# Patient Record
Sex: Female | Born: 1940 | Hispanic: No | Marital: Married | Smoking: Never smoker
Health system: Southern US, Community
[De-identification: ages and names within clinical notes are randomized; demographics above are authoritative.]

## PROBLEM LIST (undated history)

## (undated) DIAGNOSIS — H269 Unspecified cataract: Secondary | ICD-10-CM

## (undated) HISTORY — DX: Unspecified cataract: H26.9

---

## 2011-10-18 ENCOUNTER — Emergency Department (HOSPITAL_COMMUNITY)
Admission: EM | Admit: 2011-10-18 | Discharge: 2011-10-18 | Disposition: A | Discharge status: Home / Self Care | Payer: Self-pay | Attending: Emergency Medicine | Admitting: Emergency Medicine

## 2011-10-18 ENCOUNTER — Emergency Department (HOSPITAL_COMMUNITY): Payer: Self-pay

## 2011-10-18 ENCOUNTER — Encounter (HOSPITAL_COMMUNITY): Payer: Self-pay | Admitting: *Deleted

## 2011-10-18 DIAGNOSIS — W19XXXA Unspecified fall, initial encounter: Secondary | ICD-10-CM

## 2011-10-18 DIAGNOSIS — E049 Nontoxic goiter, unspecified: Secondary | ICD-10-CM | POA: Insufficient documentation

## 2011-10-18 DIAGNOSIS — S20219A Contusion of unspecified front wall of thorax, initial encounter: Secondary | ICD-10-CM | POA: Insufficient documentation

## 2011-10-18 DIAGNOSIS — W010XXA Fall on same level from slipping, tripping and stumbling without subsequent striking against object, initial encounter: Secondary | ICD-10-CM | POA: Insufficient documentation

## 2011-10-18 NOTE — Discharge Instructions (Signed)
Insufficient Money for Medicine Contact [employer]:  call "211" or [facility] [phone].  No Primary Care Doctor Call [facility]  [phone] Other agencies that provide inexpensive medical care    [facility]  [phone]    [facility]  [phone]    [facility]  [phone]    [facility]  [phone]    [facility]  [phone]    [facility]  [phone]  

## 2011-10-18 NOTE — ED Notes (Signed)
Pt reports right rib cage pain after falling 5 days ago. Hurts to take deep breath but denies sob. States tripped and fell to floor. Denies loc.

## 2011-10-18 NOTE — ED Provider Notes (Addendum)
History  This chart was scribed for Gwyneth Sprout, MD by Cherlynn Perches. The patient was seen in room STRE1/STRE1. Patient's care was started at 1101.  CSN: [id number]  Arrival date & time 10/18/11  1101   None     Chief Complaint  Patient presents with  . Fall    (Consider location/radiation/quality/duration/timing/severity/associated sxs/prior treatment) HPI  Cheryl Bradford is a 71 y.o. female who presents to the Emergency Department complaining of 5 days of sudden onset, constant right rib pain with associated pain when breathing and right knee pain related to a fall. Pt states that she tripped over a rug and fell on the floor, injuring her rib. Pt denies any head injury, SOB, coughing, and loss of consciousness.    History reviewed. No pertinent past medical history.  History reviewed. No pertinent past surgical history.  History reviewed. No pertinent family history.  History  Substance Use Topics  . Smoking status: Never Smoker   . Smokeless tobacco: Not on file  . Alcohol Use: No    OB History    Grav Para Term Preterm Abortions TAB SAB Ect Mult Living                  Review of Systems  Constitutional: Negative for fever and chills.  HENT: Negative for congestion and neck pain.   Respiratory: Negative for cough and shortness of breath.   Cardiovascular: Negative for leg swelling.  Gastrointestinal: Negative for nausea and vomiting.  Genitourinary: Negative for frequency and hematuria.  Musculoskeletal: Positive for myalgias and arthralgias. Negative for back pain.  Neurological: Negative for syncope, weakness and headaches.  Hematological: Negative.   All other systems reviewed and are negative.    Allergies  Review of patient's allergies indicates no known allergies.  Home Medications   Current Outpatient Rx  Name Route Sig Dispense Refill  . DOCUSATE SODIUM 100 MG PO CAPS Oral Take 100 mg by mouth 2 (two) times daily.    . IBUPROFEN 200 MG PO  TABS Oral Take 200 mg by mouth every 6 (six) hours as needed. For pain      BP 123/58  Pulse 91  Temp 97.9 F (36.6 C) (Oral)  Resp 20  SpO2 98%  Physical Exam  Nursing note and vitals reviewed. Constitutional: She is oriented to person, place, and time. She appears well-developed and well-nourished. No distress.  HENT:  Head: Normocephalic and atraumatic.  Eyes: EOM are normal.  Neck: Neck supple. No tracheal deviation present.  Cardiovascular: Normal rate, regular rhythm and normal heart sounds.   Pulmonary/Chest: Effort normal. No respiratory distress. She exhibits tenderness (right sideded chest tenderness).       No crepitis, normal breath sounds  Abdominal: Soft. She exhibits no distension. There is tenderness (mild RUQ tenderness). There is no rebound and no guarding.  Musculoskeletal: Normal range of motion.  Neurological: She is alert and oriented to person, place, and time.  Skin: Skin is warm and dry.  Psychiatric: She has a normal mood and affect. Her behavior is normal.    ED Course  Procedures (including critical care time)  DIAGNOSTIC STUDIES: Oxygen Saturation is 98% on room air, normal by my interpretation.    COORDINATION OF CARE: 11:55AM - Will get x-ray of rib. Pt agrees with plan.    Labs Reviewed - No data to display Dg Ribs Unilateral W/chest Right  10/18/2011  *RADIOLOGY REPORT*  Clinical Data: History of fall complaining of right-sided chest pain.  RIGHT RIBS AND CHEST -  3+ VIEW  Comparison: No priors.  Findings: Lungs appear mildly hyperexpanded with some pruning of the pulmonary vasculature in the periphery, suggesting underlying COPD.  Mild bilateral apical pleural parenchymal thickening is likely reflects scarring related to prior infections.  Mild diffuse interstitial prominence and peribronchial cuffing is favored to be chronic (likely related to underlying COPD).  No acute consolidative airspace disease.  No pleural effusion.  No pneumothorax.   Heart size is borderline enlarged.  Mediastinal contours are remarkable for prominent soft tissue at the level of the thoracic inlet which displaces the proximal trachea to the right, favored to represent an enlarged thyroid (likely a goiter). Atherosclerotic calcifications are noted within the arch of the aorta.  Dedicated views of the right ribs demonstrate no definite acute displaced rib fractures.  IMPRESSION: 1.  No acute displaced rib fractures or findings to suggest significant acute traumatic injury to the thorax. 2.  Changes suggestive of COPD, as above. 3.  Atherosclerosis. 4.  Prominent soft tissue at the level of the thoracic inlet resulting and rightward tracheal deviation.  This is favored to represent a goiter, however, if the patient has a known history of malignancy, the possibility of mediastinal adenopathy is not excluded.  This could be further evaluated with non emergent thyroid ultrasound if clinically indicated.  Original Report Authenticated By: Florencia Reasons, M.D.     1. Fall   2. Rib contusion   3. Goiter       MDM   Patient with mechanical fall 5 days ago with persistent right-sided rib cage pain when she takes a deep breath. She has no shortness of breath negative exam otherwise. She has mild right upper quadrant tenderness which she states she's had for years and she says she just doesn't like people touching her abdomen. Also she has a known history of a goiter which is present on her x-rays today. She has no sign of rib fractures and this was relayed to the family.  Will have him followup with PCP for ultrasound of her thyroid and further treatment of her goiter as needed      I personally performed the services described in this documentation, which was scribed in my presence.  The recorded information has been reviewed and considered.    Gwyneth Sprout, MD 10/18/11 [id number]  Gwyneth Sprout, MD 10/18/11 1259

## 2011-10-18 NOTE — ED Notes (Addendum)
Pt does not speak [place] . Per family pt tripped on rug and fell on rt side hurts under rt breaths hurts to take a deep breath . States no loc denies hitting her head  There is no cough. Also pt states per family  that for the past year  When she sits on soft surfaces she feels a bending in her abd/ stonmach, she has a dry mouth and swelling in her throat/ thyroid arae

## 2014-08-02 ENCOUNTER — Ambulatory Visit: Payer: Self-pay | Admitting: Family Medicine

## 2014-08-02 ENCOUNTER — Encounter: Payer: Self-pay | Admitting: Internal Medicine

## 2014-08-02 ENCOUNTER — Ambulatory Visit (INDEPENDENT_AMBULATORY_CARE_PROVIDER_SITE_OTHER): Payer: Medicaid Other | Admitting: Internal Medicine

## 2014-08-02 VITALS — BP 115/54 | HR 55 | Temp 98.2°F | Resp 16 | Ht 62.0 in | Wt 117.0 lb

## 2014-08-02 DIAGNOSIS — E049 Nontoxic goiter, unspecified: Secondary | ICD-10-CM | POA: Diagnosis not present

## 2014-08-02 DIAGNOSIS — Z23 Encounter for immunization: Secondary | ICD-10-CM | POA: Diagnosis not present

## 2014-08-02 DIAGNOSIS — R1031 Right lower quadrant pain: Secondary | ICD-10-CM

## 2014-08-02 DIAGNOSIS — H269 Unspecified cataract: Secondary | ICD-10-CM | POA: Diagnosis not present

## 2014-08-02 LAB — CBC WITH DIFFERENTIAL/PLATELET
Basophils Absolute: 0.1 10*3/uL (ref 0.0–0.1)
Basophils Relative: 1 % (ref 0–1)
EOS ABS: 0.1 10*3/uL (ref 0.0–0.7)
Eosinophils Relative: 1 % (ref 0–5)
HCT: 42.1 % (ref 36.0–46.0)
Hemoglobin: 14.2 g/dL (ref 12.0–15.0)
LYMPHS ABS: 1.9 10*3/uL (ref 0.7–4.0)
LYMPHS PCT: 37 % (ref 12–46)
MCH: 31.2 pg (ref 26.0–34.0)
MCHC: 33.7 g/dL (ref 30.0–36.0)
MCV: 92.5 fL (ref 78.0–100.0)
MPV: 12 fL (ref 8.6–12.4)
Monocytes Absolute: 0.3 10*3/uL (ref 0.1–1.0)
Monocytes Relative: 6 % (ref 3–12)
NEUTROS PCT: 55 % (ref 43–77)
Neutro Abs: 2.9 10*3/uL (ref 1.7–7.7)
Platelets: 177 10*3/uL (ref 150–400)
RBC: 4.55 MIL/uL (ref 3.87–5.11)
RDW: 13.1 % (ref 11.5–15.5)
WBC: 5.2 10*3/uL (ref 4.0–10.5)

## 2014-08-02 LAB — T3, FREE: T3, Free: 2.9 pg/mL (ref 2.3–4.2)

## 2014-08-02 LAB — COMPREHENSIVE METABOLIC PANEL
ALBUMIN: 3.9 g/dL (ref 3.5–5.2)
ALK PHOS: 76 U/L (ref 39–117)
ALT: 8 U/L (ref 0–35)
AST: 17 U/L (ref 0–37)
BILIRUBIN TOTAL: 0.4 mg/dL (ref 0.2–1.2)
BUN: 7 mg/dL (ref 6–23)
CO2: 27 mEq/L (ref 19–32)
Calcium: 8.4 mg/dL (ref 8.4–10.5)
Chloride: 105 mEq/L (ref 96–112)
Creat: 0.54 mg/dL (ref 0.50–1.10)
GLUCOSE: 94 mg/dL (ref 70–99)
POTASSIUM: 4.3 meq/L (ref 3.5–5.3)
Sodium: 139 mEq/L (ref 135–145)
Total Protein: 7.2 g/dL (ref 6.0–8.3)

## 2014-08-02 LAB — TSH: TSH: 0.876 u[IU]/mL (ref 0.350–4.500)

## 2014-08-02 LAB — T4, FREE: Free T4: 0.99 ng/dL (ref 0.80–1.80)

## 2014-08-03 ENCOUNTER — Encounter: Payer: Self-pay | Admitting: Internal Medicine

## 2014-08-03 LAB — URINALYSIS
Bilirubin Urine: NEGATIVE
Glucose, UA: NEGATIVE mg/dL
HGB URINE DIPSTICK: NEGATIVE
KETONES UR: NEGATIVE mg/dL
Leukocytes, UA: NEGATIVE
NITRITE: NEGATIVE
PH: 8 (ref 5.0–8.0)
Protein, ur: NEGATIVE mg/dL
Specific Gravity, Urine: 1.009 (ref 1.005–1.030)
UROBILINOGEN UA: 0.2 mg/dL (ref 0.0–1.0)

## 2014-08-03 NOTE — Progress Notes (Signed)
Patient ID: Cheryl Bradford, female   DOB: Jan 29, 1941, 74 y.o.   MRN: [id number]   Cheryl Bradford, is a 74 y.o. female  WUJ:[id number]  NFA:[id number]  DOB - July 02, 1940  CC:  Chief Complaint  Patient presents with  . Establish Care    stomach issues with constipation/ swelling in neck/ eye         HPI: Cheryl Bradford is a 74 y.o. female here today to establish medical care. She is an [place] non-english speaking lady who has been in the [place] since 2006. Interpreter was available by phone and her son who is fluent in english was present for the entirie  Visit. The patient's who is an Infectious Disease Physician was able to contribute to her History.   The patient denies any chronic disease and currently is not on any medications. Her complaint today is of a pain in her abdomen which has been present for more than 2 years.  She describes as of moderate intensity and is like a cramping. The pain is non-radiating and is not present at rest. A review of records shows that she presented to the ED in 2013 with c/o pain after falling. She had an evaluation including an x-ray of the ribs which showed no acute trauma to the bony structures however it did show atherosclerotic changes.   On observation. Patient is also noted to have a soft tissue mass at the neck. I spoke with her son who reports that it was present for some time which is supported by  notes from 2013 and that it has got larger since then. There is no change is the quality of her voice and she has no difficulty breathing. She denies, palpitations, Diarrhea or constipation, heat or cold intolerance or hair loss. She also denies any shin changes or rashes. She also denies known exposure to radiation.  Pt also c/o difficulty reading. Her son reports that she was diagnosed with cataracts while in [place].   Patient has No headache, No chest pain,   No Nausea, No new weakness tingling or numbness, No Cough - SOB.  No Known Allergies No past  medical history on file. Current Outpatient Prescriptions on File Prior to Visit  Medication Sig Dispense Refill  . docusate sodium (COLACE) 100 MG capsule Take 100 mg by mouth 2 (two) times daily.    Marland Kitchen ibuprofen (ADVIL,MOTRIN) 200 MG tablet Take 200 mg by mouth every 6 (six) hours as needed. For pain     No current facility-administered medications on file prior to visit.   Family History  Problem Relation Age of Onset  . Family history unknown: Yes   History   Social History  . Marital Status: Married    Spouse Name: N/A  . Number of Children: N/A  . Years of Education: N/A   Occupational History  . Not on file.   Social History Main Topics  . Smoking status: Never Smoker   . Smokeless tobacco: Not on file  . Alcohol Use: No  . Drug Use: Not on file  . Sexual Activity: Not on file   Other Topics Concern  . Not on file   Social History Narrative    Review of Systems: Constitutional: Negative for fever, chills, diaphoresis, activity change, appetite change and fatigue. HENT: Negative for ear pain, nosebleeds, congestion, facial swelling, rhinorrhea, neck pain, neck stiffness and ear discharge.  Eyes: Negative for pain, discharge, redness, itching and visual disturbance. Respiratory: Negative for cough, choking, chest tightness, shortness of breath,  wheezing and stridor.  Cardiovascular: Negative for chest pain, palpitations and leg swelling. Gastrointestinal: Negative for abdominal distention. Genitourinary: Negative for dysuria, urgency, frequency, hematuria, flank pain, decreased urine volume, difficulty urinating and dyspareunia.  Musculoskeletal: Negative for back pain, joint swelling, arthralgia and gait problem. Neurological: Negative for dizziness, tremors, seizures, syncope, facial asymmetry, speech difficulty, weakness, light-headedness, numbness and headaches.  Hematological: Negative for adenopathy. Does not bruise/bleed easily. Psychiatric/Behavioral:  Negative for hallucinations, behavioral problems, confusion, dysphoric mood, decreased concentration and agitation.     Objective:   Filed Vitals:   08/02/14 1109  BP: 115/54  Pulse: 55  Temp: 98.2 F (36.8 C)  Resp: 16    Physical Exam: Constitutional: Patient appears well-developed and well-nourished. No distress. HENT: Normocephalic, atraumatic, External right and left ear normal. Oropharynx is clear and moist.  Eyes: Conjunctivae and EOM are normal. PERRLA, no scleral icterus. Neck: Normal ROM. Neck supple. No JVD. Symmetrically enlarges thyroid which is movable and without palpable nodules. The is no stridor and no bruit auscultated. CVS: RRR, S1/S2 +, no murmurs, no gallops, no carotid bruit.  Pulmonary: Effort and breath sounds normal, no stridor, rhonchi, wheezes, rales.  Abdominal: Soft. BS +, no distension, tenderness, rebound or guarding.  Musculoskeletal: Normal range of motion. No edema and no tenderness.  Lymphadenopathy: No lymphadenopathy noted, cervical or axillary Neuro: Alert. Normal reflexes, muscle tone coordination. No cranial nerve deficit. Skin: Skin is warm and dry. No rash noted. Not diaphoretic. No erythema. No pallor. Psychiatric: Normal mood and affect. Behavior, judgment, thought content normal.  Lab Results  Component Value Date   WBC 5.2 08/02/2014   HGB 14.2 08/02/2014   HCT 42.1 08/02/2014   MCV 92.5 08/02/2014   PLT 177 08/02/2014   Lab Results  Component Value Date   CREATININE 0.54 08/02/2014   BUN 7 08/02/2014   NA 139 08/02/2014   K 4.3 08/02/2014   CL 105 08/02/2014   CO2 27 08/02/2014    No results found for: HGBA1C Lipid Panel  No results found for: CHOL, TRIG, HDL, CHOLHDL, VLDL, LDLCALC     Assessment and plan:   1. Goiter - Pt has been present for several years with no appreciable signs of thyroid dysfunction. Thus it is likely benign except that it's rapid growth could be a sing of malignancy. However I will obtain  an ultrasound to assess nodularity and evaluate sonographic features. Also obtain a TSH - TSH - T4, Free - T3, Free  2. Right lower quadrant abdominal pain - This pain has been present for several years. I'm not sure that this is a medically significant complaint. However will obtain labs to evaluate renal function and electrolytes. Also evaluate for liver function and hematologic status. - Urinalysis - Comprehensive metabolic panel - CBC with Differential/Platelet  3. Immunization due - Son reports that she may have received a Pneumonia vaccine several years ago. I will give the Prevnar as she has not received a vaccine in the last year and her son will obtain her immunization record. If unable to  Verify will give Pneumovax in 8 weeks.   - Pneumococcal conjugate vaccine 13-valent  4. Cataract - Pt's son reports a history of cataracts. I will refer to Ophthalmology for evaluation and glaucoma screen. - Ambulatory referral to Ophthalmology    Return in about 2 weeks (around 08/16/2014) for Goiter, Review of lab data.  The patient was given clear instructions to go to ER or return to medical center if symptoms don't improve,  worsen or new problems develop. The patient verbalized understanding. The patient was told to call to get lab results if they haven't heard anything in the next week.     This note has been created with Education officer, environmental. Any transcriptional errors are unintentional.    MATTHEWS,MICHELLE A., MD [facility] [place], [place] [phone]   08/03/2014, 1:48 PM

## 2014-08-16 ENCOUNTER — Other Ambulatory Visit (HOSPITAL_COMMUNITY): Payer: Medicaid Other

## 2014-08-16 ENCOUNTER — Ambulatory Visit (INDEPENDENT_AMBULATORY_CARE_PROVIDER_SITE_OTHER): Payer: Medicaid Other | Admitting: Internal Medicine

## 2014-08-16 VITALS — BP 123/57 | HR 78 | Temp 98.4°F | Resp 16 | Ht 62.0 in | Wt 118.0 lb

## 2014-08-16 DIAGNOSIS — R221 Localized swelling, mass and lump, neck: Secondary | ICD-10-CM | POA: Diagnosis not present

## 2014-08-16 DIAGNOSIS — Z1211 Encounter for screening for malignant neoplasm of colon: Secondary | ICD-10-CM

## 2014-08-17 LAB — THYROID PEROXIDASE ANTIBODY: Thyroperoxidase Ab SerPl-aCnc: 1 IU/mL (ref <9)

## 2014-08-22 ENCOUNTER — Ambulatory Visit (HOSPITAL_COMMUNITY): Payer: Medicaid Other

## 2014-08-23 ENCOUNTER — Ambulatory Visit (HOSPITAL_COMMUNITY)
Admission: RE | Admit: 2014-08-23 | Discharge: 2014-08-23 | Disposition: A | Discharge status: Home / Self Care | Payer: Medicaid Other | Source: Ambulatory Visit | Attending: Internal Medicine | Admitting: Internal Medicine

## 2014-08-23 DIAGNOSIS — E010 Iodine-deficiency related diffuse (endemic) goiter: Secondary | ICD-10-CM | POA: Insufficient documentation

## 2014-08-23 DIAGNOSIS — R221 Localized swelling, mass and lump, neck: Secondary | ICD-10-CM | POA: Insufficient documentation

## 2014-10-29 ENCOUNTER — Encounter: Payer: Self-pay | Admitting: Internal Medicine

## 2014-10-29 ENCOUNTER — Telehealth: Payer: Self-pay | Admitting: Internal Medicine

## 2014-10-29 ENCOUNTER — Other Ambulatory Visit: Payer: Self-pay | Admitting: Internal Medicine

## 2014-10-29 DIAGNOSIS — R221 Localized swelling, mass and lump, neck: Secondary | ICD-10-CM

## 2014-10-29 NOTE — Telephone Encounter (Signed)
Spoke with Pt's son Mr. Jabier MuttonSima Takele and made hin aware of the U/S findings. He will call tomorrow and make an appointment to bring patietn in as he did not make the 4 wee appointment. She is referred to ENT for biopsy and further evaluation of the neck mass.

## 2014-10-29 NOTE — Progress Notes (Signed)
Patient ID: Cheryl Bradford, Cheryl Bradford   Cheryl: November 28, 1940, 74 y.o.   MRN: [id number]   Cheryl Bradford, is a 74 y.o. Cheryl Bradford  WUJ:[id number]  NFA:[id number]  Cheryl Bradford  CC:  Chief Complaint  Patient presents with  . Follow-up  . Goiter       HPI: Cheryl Bradford is a 74 y.o. Cheryl Bradford here today to follow up on goiter. She denies any obstructive symptoms. No difficulty swallowing, no cough no difficulty breathing. I have reviewed her thyroid panel with her and her son and the levels are all normal. I have referral for ultrasound discussed biopsy and she is reluctant. I have discussed with her son that if U/S suspicious will refer for biopsy/ENT.  Patient has No headache, No chest pain, No abdominal pain - No Nausea, No new weakness tingling or numbness, No Cough - SOB.  No Known Allergies Past Medical History  Diagnosis Date  . Cataract    Current Outpatient Prescriptions on File Prior to Visit  Medication Sig Dispense Refill  . docusate sodium (COLACE) 100 MG capsule Take 100 mg by mouth 2 (two) times daily.    Marland Kitchen ibuprofen (ADVIL,MOTRIN) 200 MG tablet Take 200 mg by mouth every 6 (six) hours as needed. For pain     No current facility-administered medications on file prior to visit.   Family History  Problem Relation Age of Onset  . Family history unknown: Yes   History   Social History  . Marital Status: Married    Spouse Name: N/A  . Number of Children: N/A  . Years of Education: N/A   Occupational History  . Not on file.   Social History Main Topics  . Smoking status: Never Smoker   . Smokeless tobacco: Not on file  . Alcohol Use: No  . Drug Use: No  . Sexual Activity: No   Other Topics Concern  . Not on file   Social History Narrative    Review of Systems: Constitutional: Negative for fever, chills, diaphoresis, activity change, appetite change and fatigue. HENT: Negative for ear pain, nosebleeds, congestion, facial swelling, rhinorrhea, neck pain, neck  stiffness and ear discharge.  Eyes: Negative for pain, discharge, redness, itching and visual disturbance. Respiratory: Negative for cough, choking, chest tightness, shortness of breath, wheezing and stridor.  Cardiovascular: Negative for chest pain, palpitations and leg swelling. Gastrointestinal: Negative for abdominal distention. Genitourinary: Negative for dysuria, urgency, frequency, hematuria, flank pain, decreased urine volume, difficulty urinating and dyspareunia.  Musculoskeletal: Negative for back pain, joint swelling, arthralgia and gait problem. Neurological: Negative for dizziness, tremors, seizures, syncope, facial asymmetry, speech difficulty, weakness, light-headedness, numbness and headaches.  Hematological: Negative for adenopathy. Does not bruise/bleed easily. Psychiatric/Behavioral: Negative for hallucinations, behavioral problems, confusion, dysphoric mood, decreased concentration and agitation.     Objective:   Filed Vitals:   08/16/14 1134  BP: 123/57  Pulse: 78  Temp: 98.4 F (36.9 C)  Resp: 16    Physical Exam: Constitutional: Patient appears well-developed and well-nourished. No distress. HENT: Normocephalic, atraumatic, External right and left ear normal. Oropharynx is clear and moist.  Eyes: Conjunctivae and EOM are normal. PERRLA, no scleral icterus. Neck: Normal ROM. Neck supple. No JVD. No tracheal deviation. No thyromegaly. CVS: RRR, S1/S2 +, no murmurs, no gallops, no carotid bruit.  Pulmonary: Effort and breath sounds normal, no stridor, rhonchi, wheezes, rales.  Abdominal: Soft. BS +, no distension, tenderness, rebound or guarding.  Musculoskeletal: Normal range of motion. No edema and no tenderness.  Lymphadenopathy: No  lymphadenopathy noted, cervical, inguinal or axillary Neuro: Alert. Normal reflexes, muscle tone coordination. No cranial nerve deficit. Skin: Skin is warm and dry. No rash noted. Not diaphoretic. No erythema. No  pallor. Psychiatric: Normal mood and affect. Behavior, judgment, thought content normal.   Lab Results  Component Value Date   WBC 5.2 08/02/2014   HGB 14.2 08/02/2014   HCT 42.1 08/02/2014   MCV 92.5 08/02/2014   PLT 177 08/02/2014   Lab Results  Component Value Date   CREATININE 0.54 08/02/2014   BUN 7 08/02/2014   NA 139 08/02/2014   K 4.3 08/02/2014   CL 105 08/02/2014   CO2 27 08/02/2014    No results found for: HGBA1C Lipid Panel  No results found for: CHOL, TRIG, HDL, CHOLHDL, VLDL, LDLCALC     Assessment and plan:   1. Mass of neck - Will refer for U/S. If suspicious will refer to ENT and for biopsy - Thyroid Peroxidase Antibody - Ambulatory referral to Endocrinology - US Soft Tissue Head/Neck; Future  2. Colon cancer screening - Ambulatory referral to Gastroenterology   Return in about 4 weeks (around 09/13/2014) for Annual Physical.  The patient was given clear instructions to go to ER or return to medical center if symptoms don't improve, worsen or new problems develop. The patient verbalized understanding. The patient was told to call to get lab results if they haven't heard anything in the next week.     This note has been created with Education officer, environmentalDragon speech recognition software and smart phrase technology. Any transcriptional errors are unintentional.    MATTHEWS,MICHELLE A., MD [facility] [place], [place] 714 088 [phone]   10/29/2014, 4:44 PM

## 2014-10-29 NOTE — Progress Notes (Signed)
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 Findings meet concensus for criteria for biopsy. Will refer to ENT and interventional radiology for biopsy.

## 2014-11-07 ENCOUNTER — Other Ambulatory Visit: Payer: Self-pay | Admitting: Internal Medicine

## 2014-11-07 DIAGNOSIS — E041 Nontoxic single thyroid nodule: Secondary | ICD-10-CM

## 2014-11-08 ENCOUNTER — Ambulatory Visit (HOSPITAL_COMMUNITY): Admission: RE | Admit: 2014-11-08 | Payer: Medicaid Other | Source: Ambulatory Visit

## 2014-11-08 ENCOUNTER — Telehealth (HOSPITAL_COMMUNITY): Payer: Self-pay | Admitting: Hematology

## 2014-11-08 NOTE — Telephone Encounter (Signed)
I called and spoke with patient's son.  I advised that [facility].  Son states that patient is refusing to go this appointment as she doesn't want to do anything about the possible nodule.  Son insisted that this was the patient's wishes and he wanted to cancel the appointment.  Contact information given for [facility].

## 2014-11-14 ENCOUNTER — Ambulatory Visit: Payer: Medicaid Other | Admitting: Family Medicine

## 2014-11-17 ENCOUNTER — Emergency Department (HOSPITAL_COMMUNITY): Payer: Medicaid Other

## 2014-11-17 ENCOUNTER — Encounter (HOSPITAL_COMMUNITY): Payer: Self-pay | Admitting: Emergency Medicine

## 2014-11-17 ENCOUNTER — Emergency Department (HOSPITAL_COMMUNITY)
Admission: EM | Admit: 2014-11-17 | Discharge: 2014-11-17 | Disposition: A | Discharge status: Home / Self Care | Payer: Medicaid Other | Attending: Emergency Medicine | Admitting: Emergency Medicine

## 2014-11-17 DIAGNOSIS — R1031 Right lower quadrant pain: Secondary | ICD-10-CM

## 2014-11-17 DIAGNOSIS — Z79899 Other long term (current) drug therapy: Secondary | ICD-10-CM | POA: Diagnosis not present

## 2014-11-17 DIAGNOSIS — K5900 Constipation, unspecified: Secondary | ICD-10-CM | POA: Diagnosis not present

## 2014-11-17 LAB — URINALYSIS, ROUTINE W REFLEX MICROSCOPIC
BILIRUBIN URINE: NEGATIVE
GLUCOSE, UA: NEGATIVE mg/dL
KETONES UR: NEGATIVE mg/dL
Nitrite: NEGATIVE
PH: 7 (ref 5.0–8.0)
Protein, ur: NEGATIVE mg/dL
SPECIFIC GRAVITY, URINE: 1.007 (ref 1.005–1.030)
Urobilinogen, UA: 0.2 mg/dL (ref 0.0–1.0)

## 2014-11-17 LAB — COMPREHENSIVE METABOLIC PANEL
ALK PHOS: 70 U/L (ref 38–126)
ALT: 9 U/L — AB (ref 14–54)
AST: 20 U/L (ref 15–41)
Albumin: 3.9 g/dL (ref 3.5–5.0)
Anion gap: 7 (ref 5–15)
BUN: 11 mg/dL (ref 6–20)
CALCIUM: 8.5 mg/dL — AB (ref 8.9–10.3)
CO2: 25 mmol/L (ref 22–32)
CREATININE: 0.54 mg/dL (ref 0.44–1.00)
Chloride: 106 mmol/L (ref 101–111)
GLUCOSE: 92 mg/dL (ref 65–99)
Potassium: 4.1 mmol/L (ref 3.5–5.1)
SODIUM: 138 mmol/L (ref 135–145)
TOTAL PROTEIN: 7.3 g/dL (ref 6.5–8.1)
Total Bilirubin: 0.6 mg/dL (ref 0.3–1.2)

## 2014-11-17 LAB — CBC WITH DIFFERENTIAL/PLATELET
BASOS ABS: 0 10*3/uL (ref 0.0–0.1)
BASOS PCT: 0 % (ref 0–1)
EOS PCT: 2 % (ref 0–5)
Eosinophils Absolute: 0.1 10*3/uL (ref 0.0–0.7)
HEMATOCRIT: 40.1 % (ref 36.0–46.0)
HEMOGLOBIN: 12.7 g/dL (ref 12.0–15.0)
Lymphocytes Relative: 42 % (ref 12–46)
Lymphs Abs: 2.5 10*3/uL (ref 0.7–4.0)
MCH: 31.7 pg (ref 26.0–34.0)
MCHC: 31.7 g/dL (ref 30.0–36.0)
MCV: 100 fL (ref 78.0–100.0)
MONO ABS: 0.3 10*3/uL (ref 0.1–1.0)
Monocytes Relative: 5 % (ref 3–12)
Neutro Abs: 3.2 10*3/uL (ref 1.7–7.7)
Neutrophils Relative %: 51 % (ref 43–77)
Platelets: 159 10*3/uL (ref 150–400)
RBC: 4.01 MIL/uL (ref 3.87–5.11)
RDW: 12.5 % (ref 11.5–15.5)
WBC: 6.1 10*3/uL (ref 4.0–10.5)

## 2014-11-17 LAB — URINE MICROSCOPIC-ADD ON

## 2014-11-17 MED ORDER — IOHEXOL 300 MG/ML  SOLN
50.0000 mL | Freq: Once | INTRAMUSCULAR | Status: AC | PRN
Start: 2014-11-17 — End: 2014-11-17
  Administered 2014-11-17: 50 mL via ORAL

## 2014-11-17 MED ORDER — IOHEXOL 300 MG/ML  SOLN
100.0000 mL | Freq: Once | INTRAMUSCULAR | Status: AC | PRN
  Administered 2014-11-17: 80 mL via INTRAVENOUS

## 2014-11-17 MED ORDER — DOCUSATE SODIUM 100 MG PO CAPS: 100.0000 mg | ORAL_CAPSULE | Freq: Two times a day (BID) | ORAL | Status: AC

## 2014-11-17 NOTE — ED Notes (Signed)
Awake. Verbally responsive. Resp even and unlabored. No audible adventitious breath sounds noted. ABC's intact. No N/V/D reported. IV saline lock patent and intact. 

## 2014-11-17 NOTE — Discharge Instructions (Signed)
Your CT scan demonstrates constipation and a cyst on your left kidney.  Please follow up with your family doctor for recheck and further evaluation. Take Miralax, available over the counter once daily.     Abdominal Pain Many things can cause abdominal pain. Usually, abdominal pain is not caused by a disease and will improve without treatment. It can often be observed and treated at home. Your health care provider will do a physical exam and possibly order blood tests and X-rays to help determine the seriousness of your pain. However, in many cases, more time must pass before a clear cause of the pain can be found. Before that point, your health care provider may not know if you need more testing or further treatment. HOME CARE INSTRUCTIONS  Monitor your abdominal pain for any changes. The following actions may help to alleviate any discomfort you are experiencing:  Only take over-the-counter or prescription medicines as directed by your health care provider.  Do not take laxatives unless directed to do so by your health care provider.  Try a clear liquid diet (broth, tea, or water) as directed by your health care provider. Slowly move to a bland diet as tolerated. SEEK MEDICAL CARE IF:  You have unexplained abdominal pain.  You have abdominal pain associated with nausea or diarrhea.  You have pain when you urinate or have a bowel movement.  You experience abdominal pain that wakes you in the night.  You have abdominal pain that is worsened or improved by eating food.  You have abdominal pain that is worsened with eating fatty foods.  You have a fever. SEEK IMMEDIATE MEDICAL CARE IF:   Your pain does not go away within 2 hours.  You keep throwing up (vomiting).  Your pain is felt only in portions of the abdomen, such as the right side or the left lower portion of the abdomen.  You pass bloody or black tarry stools. MAKE SURE YOU:  Understand these instructions.   Will watch  your condition.   Will get help right away if you are not doing well or get worse.  Document Released: 01/28/2005 Document Revised: 04/25/2013 Document Reviewed: 12/28/2012 [facility] Patient Information 2015 [employer] [place]. This information is not intended to replace advice given to you by your health care provider. Make sure you discuss any questions you have with your health care provider.

## 2014-11-17 NOTE — ED Provider Notes (Signed)
CSN: [id number]     Arrival date & time 11/17/14  1301 History   First MD Initiated Contact with Patient 11/17/14 1325     Chief Complaint  Patient presents with  . Abdominal Pain     The history is provided by the patient and a relative. No language interpreter was used.   Cheryl Bradford presents for evaluation of abdominal pain. History is provided by the patient and her son who is acting as Equities trader. She reports 3 years of right lower quadrant abdominal pain. Pain is waxing and waning. Pain usually lasts for about a day at a time when it does occur. It may occur once every couple days or once a month. When she has the pain it is worse with bending and movement. She denies any fevers, nausea, vomiting, diarrhea, dysuria. She has chronic constipation, last BM today. She was evaluated by her family doctor for this pain and had lab work done that was negative. She has no history of abdominal surgery. She has no current pain, last pain episode was 3 days ago.  Past Medical History  Diagnosis Date  . Cataract    History reviewed. No pertinent past surgical history. Family History  Problem Relation Age of Onset  . Family history unknown: Yes   History  Substance Use Topics  . Smoking status: Never Smoker   . Smokeless tobacco: Not on file  . Alcohol Use: No   OB History    No data available     Review of Systems  All other systems reviewed and are negative.     Allergies  Review of patient's allergies indicates no known allergies.  Home Medications   Prior to Admission medications   Medication Sig Start Date End Date Taking? Authorizing Provider  docusate sodium (COLACE) 100 MG capsule Take 100 mg by mouth 2 (two) times daily.    Historical Provider, MD  ibuprofen (ADVIL,MOTRIN) 200 MG tablet Take 200 mg by mouth every 6 (six) hours as needed. For pain    Historical Provider, MD   BP 132/69 mmHg  Pulse 79  Temp(Src) 98.6 F (37 C) (Oral)  Resp 18  Ht 5' (1.524 m)  Wt  140 lb (63.504 kg)  BMI 27.34 kg/m2  SpO2 98% Physical Exam  Constitutional: She is oriented to person, place, and time. She appears well-developed and well-nourished.  HENT:  Head: Normocephalic and atraumatic.  Cardiovascular: Normal rate and regular rhythm.   No murmur heard. Pulmonary/Chest: Effort normal and breath sounds normal. No respiratory distress.  Abdominal: Soft. There is no tenderness. There is no rebound and no guarding.  Musculoskeletal: She exhibits no edema or tenderness.  Neurological: She is alert and oriented to person, place, and time.  Skin: Skin is warm and dry.  Psychiatric: She has a normal mood and affect. Her behavior is normal.  Nursing note and vitals reviewed.   ED Course  Procedures (including critical care time) Labs Review Labs Reviewed  COMPREHENSIVE METABOLIC PANEL - Abnormal; Notable for the following:    Calcium 8.5 (*)    ALT 9 (*)    All other components within normal limits  URINALYSIS, ROUTINE W REFLEX MICROSCOPIC (NOT AT [facility]) - Abnormal; Notable for the following:    Hgb urine dipstick TRACE (*)    Leukocytes, UA TRACE (*)    All other components within normal limits  CBC WITH DIFFERENTIAL/PLATELET  URINE MICROSCOPIC-ADD ON    Imaging Review Ct Abdomen Pelvis W Contrast  11/17/2014  CLINICAL DATA:  Right lower quadrant pain for 3 days  EXAM: CT ABDOMEN AND PELVIS WITH CONTRAST  TECHNIQUE: Multidetector CT imaging of the abdomen and pelvis was performed using the standard protocol following bolus administration of intravenous contrast.  CONTRAST:  80mL OMNIPAQUE IOHEXOL 300 MG/ML SOLN, 50mL OMNIPAQUE IOHEXOL 300 MG/ML SOLN  COMPARISON:  None.  FINDINGS: Sagittal images of the spine shows diffuse osteopenia. Mild degenerative changes lumbar spine. The lung bases are unremarkable.  Enhanced liver shows mild intrahepatic biliary ductal dilatation. No calcified gallstones are noted within gallbladder. CBD measures 5 mm in diameter.  Atherosclerotic calcifications of abdominal aorta and iliac arteries. No aortic aneurysm.  The pancreas, spleen and adrenal glands are unremarkable. Kidneys are symmetrical in enhancement. There is a large cyst in mid and lower pole of the left kidney deforming the renal contour measures 6.1 x 7.3 cm. No hydronephrosis or hydroureter.  Delayed renal images shows bilateral renal symmetrical excretion. 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 Moderate stool noted throughout the colon. There is no pericecal inflammation. Normal appendix is noted in coronal image 64. 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  There is a retroflexed uterus. The uterus is mild atrophic. No adnexal mass. Some colonic stool noted in distal sigmoid colon and rectum. Nonspecific mild thickening of urinary bladder wall. No inguinal adenopathy. No destructive bony lesions are noted within pelvis. Degenerative changes pubic symphysis. Mild degenerative changes bilateral SI joints.  There is a cyst in right hepatic lobe anteriorly measures 2.3 cm.  There is no small bowel obstruction. No ascites or free air. No adenopathy.  IMPRESSION: 1. Mild intrahepatic biliary ductal dilatation. 2. Large cyst is noted in mid and lower pole of the left kidney measures 6.1 x 7.3 cm. 3. No hydronephrosis or hydroureter. 4. 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 5. Moderate stool noted in right colon and transverse colon. Normal appendix. No pericecal inflammation. 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 6. No small bowel obstruction. 7. Retroflexed uterus.   Electronically Signed   By: Natasha MeadLiviu  Pop M.D.   On: 11/17/2014 15:20     EKG Interpretation None      MDM   Final diagnoses:  Right lower quadrant abdominal pain  Constipation, unspecified constipation type    Patient without any significant medical history here for progressive right lower quadrant abdominal pain over  the last 3 years, she has no active pain in an apartment. Labs are unremarkable and CT scan demonstrates constipation but no significant abnormality. Offered patient and son and reassurance and discussed home care for constipation as well as the need for PCP and gastroenterology follow-up for further evaluation. On repeat evaluation patient continues to have no abdominal pain in the department.    Tilden FossaElizabeth Janat Tabbert, MD 11/17/14 [phone]

## 2014-11-17 NOTE — ED Notes (Addendum)
Intermittent RLQ pain x3 yrs. Pain worse with bending. No n/v/d. Abd soft/nondistended but tender to palpate in all quadrants. LBM was today. Pt has seen PMD but continues to have pain.

## 2016-05-21 IMAGING — CT CT ABD-PELV W/ CM
2 of 5 series · 16 of 46 positions shown, 18 images · IV contrast (OMNIPAQUE 300)
Comparison: None.

CLINICAL DATA: Right lower quadrant pain for 3 days

EXAM:
CT ABDOMEN AND PELVIS WITH CONTRAST
TECHNIQUE: Multidetector CT imaging of the abdomen and pelvis was performed
using the standard protocol following bolus administration of
intravenous contrast.
CONTRAST:  80mL OMNIPAQUE IOHEXOL 300 MG/ML SOLN, 50mL OMNIPAQUE
IOHEXOL 300 MG/ML SOLN

[Series 2: abd/pel with · axial · 0.74mm/px · z∈[-559,-214]mm · 13 of 79 slices shown, 15 images]
[im 5/79  soft-tissue]
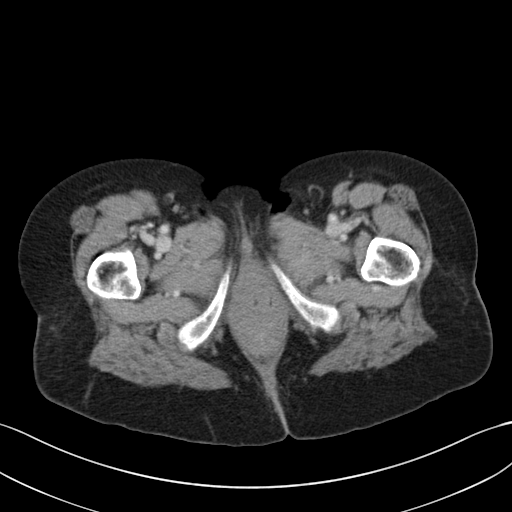
[im 5/79  bone]
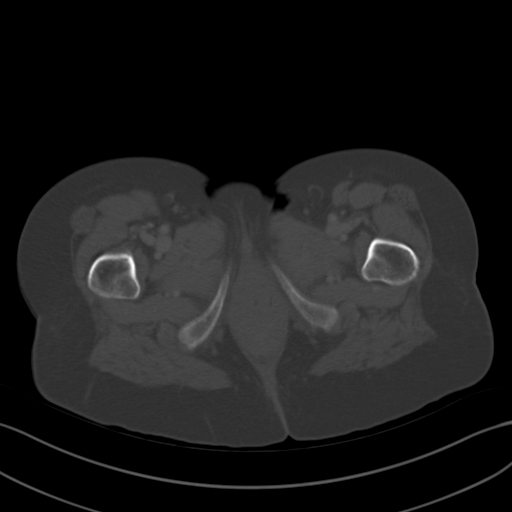
[im 13/79  soft-tissue]
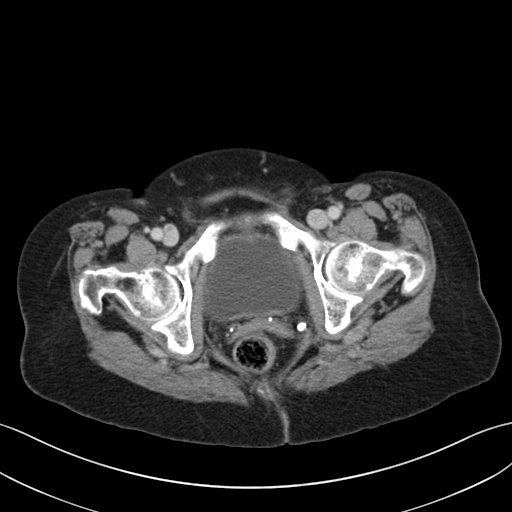
[im 17/79  soft-tissue]
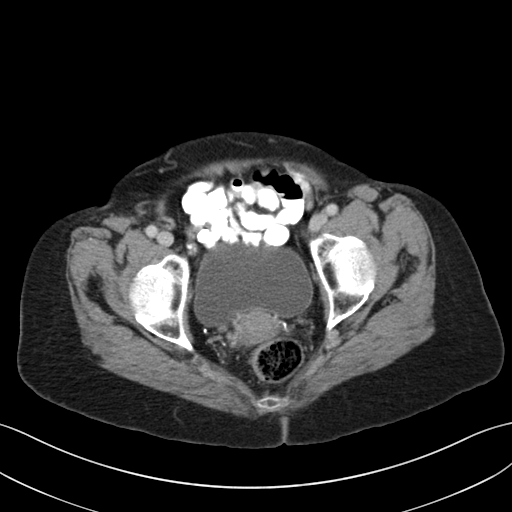
[im 21/79  soft-tissue]
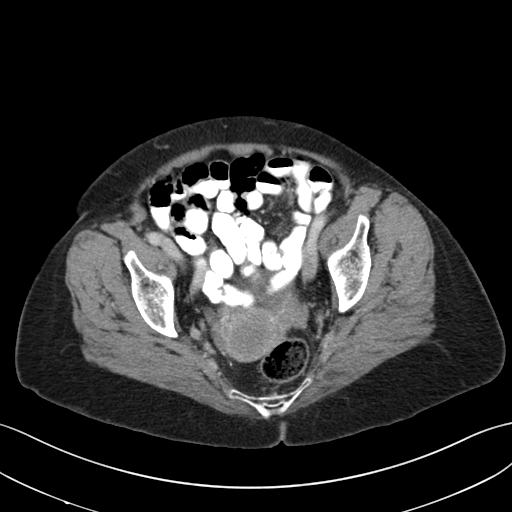
[im 29/79  soft-tissue]
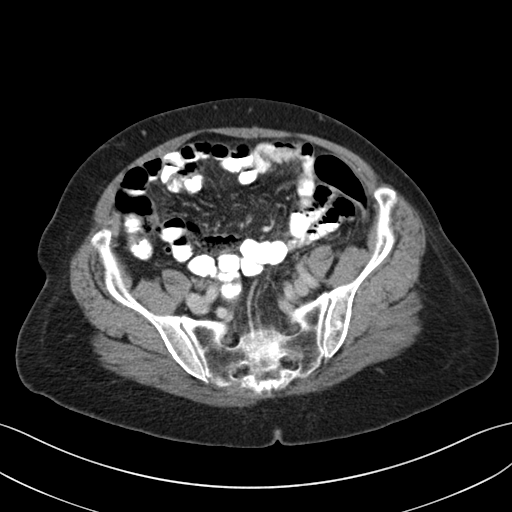
[im 33/79  soft-tissue]
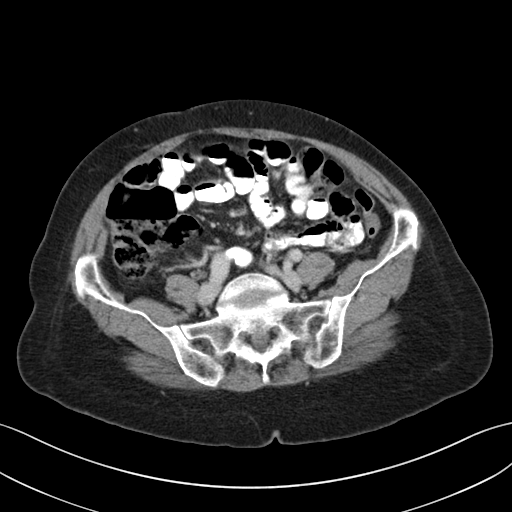
[im 42/79  soft-tissue]
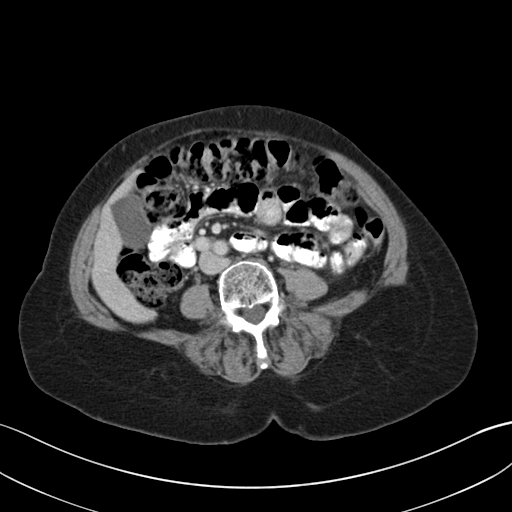
[im 46/79  soft-tissue]
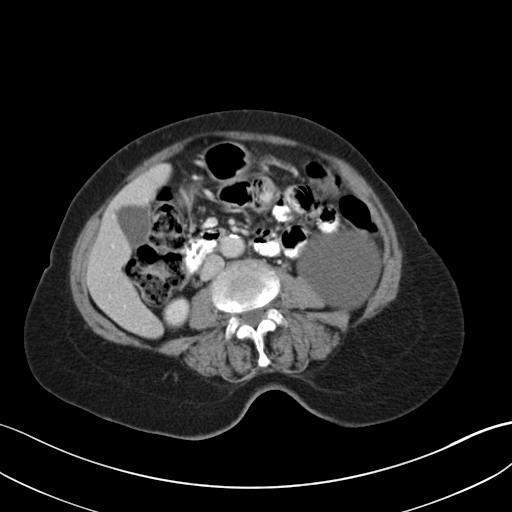
[im 50/79  soft-tissue]
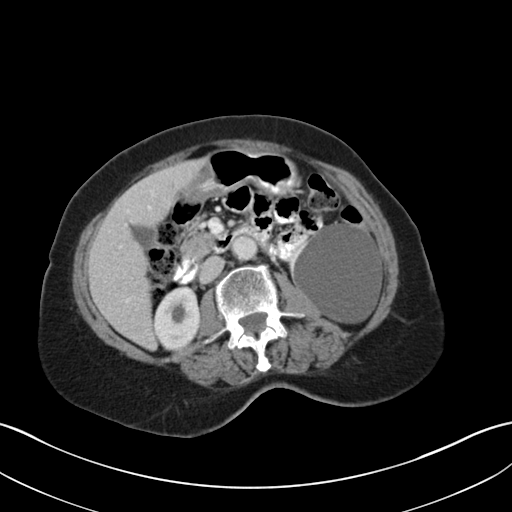
[im 50/79  bone]
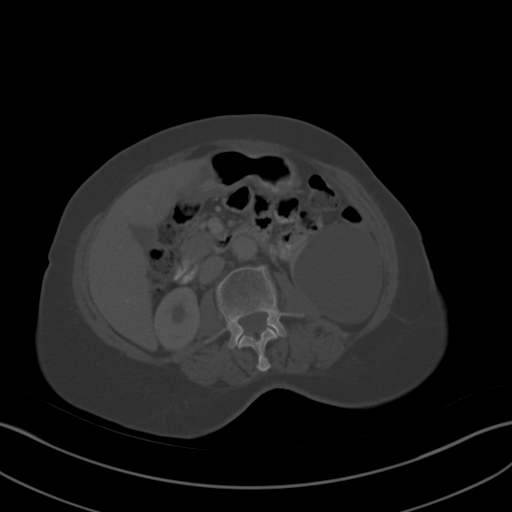
[im 58/79  soft-tissue]
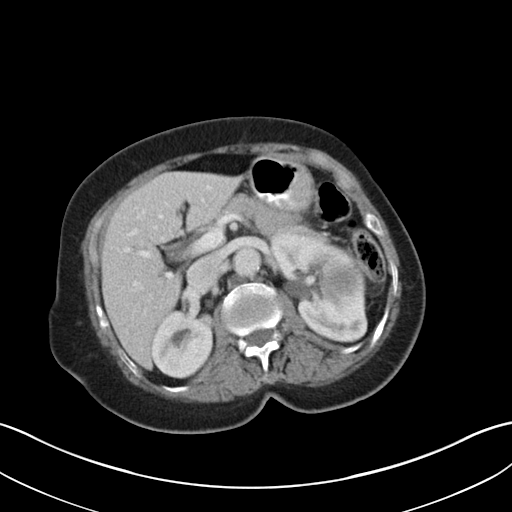
[im 62/79  soft-tissue]
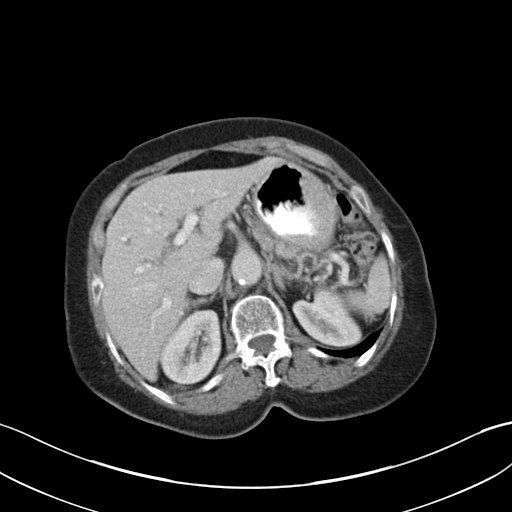
[im 66/79  soft-tissue]
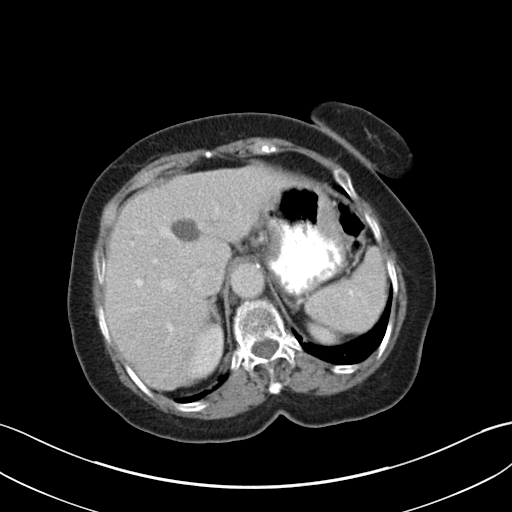
[im 74/79  soft-tissue]
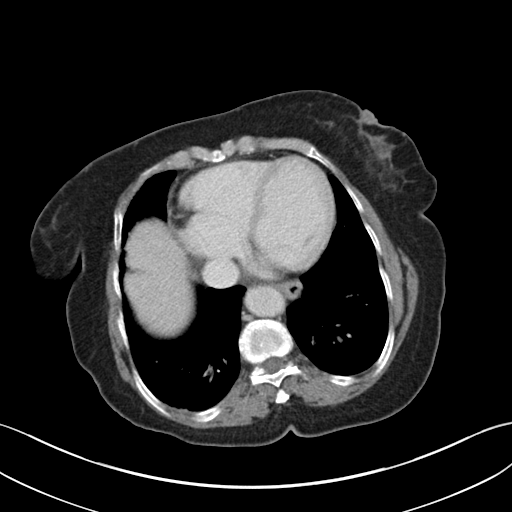

[Series 4: coronal a/|p · coronal · 0.82mm/px · 3 of 126 slices shown]
[im 56/126  soft-tissue]
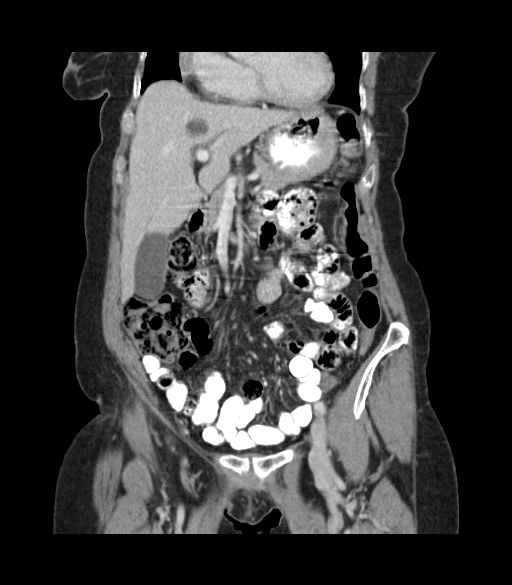
[im 70/126  soft-tissue]
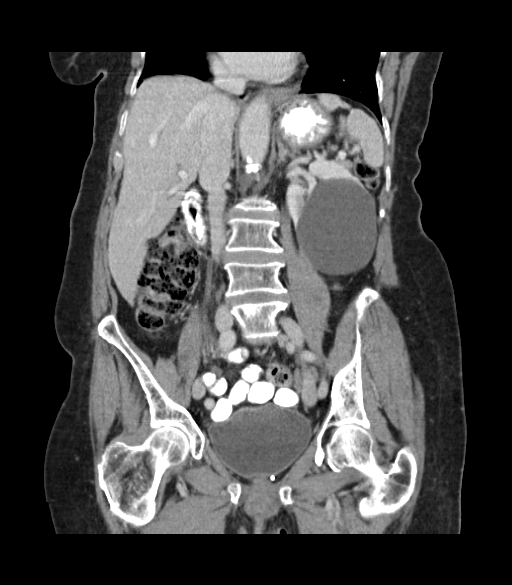
[im 84/126  soft-tissue]
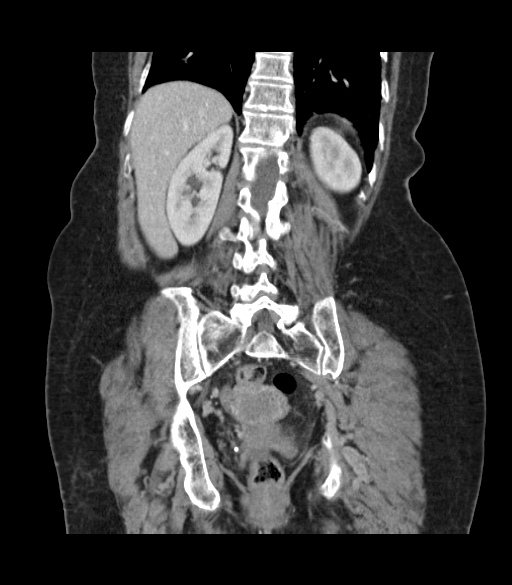

[16 of 46 positions shown; findings below may reference images not displayed]

FINDINGS: Sagittal images of the spine shows diffuse osteopenia. Mild
degenerative changes lumbar spine. The lung bases are unremarkable.

Enhanced liver shows mild intrahepatic biliary ductal dilatation. No
calcified gallstones are noted within gallbladder. CBD measures 5 mm
in diameter. Atherosclerotic calcifications of abdominal aorta and
iliac arteries. No aortic aneurysm.

The pancreas, spleen and adrenal glands are unremarkable. Kidneys
are symmetrical in enhancement. There is a large cyst in mid and
lower pole of the left kidney deforming the renal contour measures
6.1 x 7.3 cm. No hydronephrosis or hydroureter.

Delayed renal images shows bilateral renal symmetrical excretion.
There is a duplicated right ureter. Moderate stool noted throughout
the colon. There is no pericecal inflammation. Normal appendix is
noted in coronal image 64. There is fecal like material within
terminal ileum probable incompetent ileocecal valve.

There is a retroflexed uterus. The uterus is mild atrophic. No
adnexal mass. Some colonic stool noted in distal sigmoid colon and
rectum. Nonspecific mild thickening of urinary bladder wall. No
inguinal adenopathy. No destructive bony lesions are noted within
pelvis. Degenerative changes pubic symphysis. Mild degenerative
changes bilateral SI joints.

There is a cyst in right hepatic lobe anteriorly measures 2.3 cm.

There is no small bowel obstruction. No ascites or free air. No
adenopathy.
IMPRESSION: 1. Mild intrahepatic biliary ductal dilatation.
2. Large cyst is noted in mid and lower pole of the left kidney
measures 6.1 x 7.3 cm.
3. No hydronephrosis or hydroureter.
4. There is a duplicated right ureter.
5. Moderate stool noted in right colon and transverse colon. Normal
appendix. No pericecal inflammation. Fecal like material within
terminal ileum probable incompetent ileocecal valve.
6. No small bowel obstruction.
7. Retroflexed uterus.

## 2016-07-03 ENCOUNTER — Ambulatory Visit: Payer: Medicaid Other | Admitting: Family Medicine
